# Patient Record
Sex: Male | Born: 1980 | Race: Black or African American | Hispanic: No | Marital: Single | Smoking: Current every day smoker
Health system: Southern US, Community
[De-identification: ages and names within clinical notes are randomized; demographics above are authoritative.]

## PROBLEM LIST (undated history)

## (undated) ENCOUNTER — Emergency Department (HOSPITAL_COMMUNITY): Payer: Self-pay

---
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
## 2008-12-05 ENCOUNTER — Emergency Department (HOSPITAL_COMMUNITY): Admission: EM | Admit: 2008-12-05 | Discharge: 2008-12-05 | Payer: Self-pay | Admitting: Emergency Medicine
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
## 2010-10-28 ENCOUNTER — Emergency Department (HOSPITAL_COMMUNITY)
Admission: EM | Admit: 2010-10-28 | Discharge: 2010-10-29 | Disposition: A | Discharge status: Home / Self Care | Payer: No Typology Code available for payment source | Attending: Emergency Medicine | Admitting: Emergency Medicine

## 2010-10-28 DIAGNOSIS — R51 Headache: Secondary | ICD-10-CM | POA: Insufficient documentation

## 2010-10-28 DIAGNOSIS — M545 Low back pain, unspecified: Secondary | ICD-10-CM | POA: Insufficient documentation

## 2011-01-01 LAB — COMPREHENSIVE METABOLIC PANEL
Albumin: 4.1 g/dL (ref 3.5–5.2)
BUN: 10 mg/dL (ref 6–23)
Calcium: 9 mg/dL (ref 8.4–10.5)
Glucose, Bld: 92 mg/dL (ref 70–99)
Potassium: 4 mEq/L (ref 3.5–5.1)
Total Protein: 6.9 g/dL (ref 6.0–8.3)

## 2011-01-01 LAB — CBC
HCT: 46.9 % (ref 39.0–52.0)
Hemoglobin: 16 g/dL (ref 13.0–17.0)
MCHC: 34 g/dL (ref 30.0–36.0)
Platelets: 197 10*3/uL (ref 150–400)
RDW: 13.7 % (ref 11.5–15.5)

## 2011-01-01 LAB — URINE MICROSCOPIC-ADD ON

## 2011-01-01 LAB — URINALYSIS, ROUTINE W REFLEX MICROSCOPIC
Glucose, UA: NEGATIVE mg/dL
Leukocytes, UA: NEGATIVE
Specific Gravity, Urine: 1.028 (ref 1.005–1.030)
pH: 5.5 (ref 5.0–8.0)

## 2015-11-23 ENCOUNTER — Ambulatory Visit (INDEPENDENT_AMBULATORY_CARE_PROVIDER_SITE_OTHER): Payer: Self-pay | Admitting: Family Medicine

## 2015-11-23 VITALS — BP 124/90 | HR 62 | Temp 97.3°F | Resp 18 | Ht 69.5 in | Wt 187.0 lb

## 2015-11-23 DIAGNOSIS — Z024 Encounter for examination for driving license: Secondary | ICD-10-CM

## 2015-11-23 DIAGNOSIS — Z021 Encounter for pre-employment examination: Secondary | ICD-10-CM

## 2015-11-23 NOTE — Patient Instructions (Signed)
Return as needed  Advised to stop smoking as discussed.

## 2015-11-23 NOTE — Progress Notes (Signed)
Patient ID: Kenneth Wiley, male    DOB: 18-May-1981  Age: 35 y.o. MRN: [id number]  Chief Complaint  Patient presents with  . Annual Exam    DOT PE    Subjective:   35 year old male who is here for his DOT physical examination. He takes these every 2 years and has not had any problems in getting his card.  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 He has 2 children, 2 stepchildren. He does smoke and we discussed quitting.  Review of systems: Unremarkable    Current allergies, medications, problem list, past/family and social histories reviewed.  Objective:  Pulse 62  Temp(Src) 97.3 F (36.3 C) (Oral)  Resp 18  Ht 5' 9.5" (1.765 m)  Wt 187 lb (84.823 kg)  BMI 27.23 kg/m2  SpO2 98%  Physical exam: Healthy-appearing young man in no acute distress. TMs normal. Eyes PERRLA. EOMs intact. Throat clear. Neck supple without nodes or thyromegaly. No carotid bruits. Chest is clear to auscultation. Heart regular without murmurs. Abdomen soft without mass or tenderness. Normal male external genitalia with testes descended. No hernias. Spine normal. Extremities unremarkable. Skin has a little mottling of color on his trunk but otherwise unremarkable.  Assessment & Plan:   Assessment: 1. Driver's permit physical examination       Plan: 2 year DOT card  No orders of the defined types were placed in this encounter.    No orders of the defined types were placed in this encounter.         Patient Instructions  Return as needed  Advised to stop smoking as discussed.     Return if symptoms worsen or fail to improve.   Atalaya Zappia, MD 11/23/2015

## 2017-11-24 ENCOUNTER — Encounter: Payer: Self-pay | Admitting: Physician Assistant

## 2017-11-24 ENCOUNTER — Other Ambulatory Visit: Payer: Self-pay

## 2017-11-24 ENCOUNTER — Ambulatory Visit (INDEPENDENT_AMBULATORY_CARE_PROVIDER_SITE_OTHER): Payer: Self-pay | Admitting: Physician Assistant

## 2017-11-24 VITALS — BP 120/80 | HR 80 | Temp 97.8°F | Resp 16 | Ht 69.5 in | Wt 186.2 lb

## 2017-11-24 DIAGNOSIS — F17200 Nicotine dependence, unspecified, uncomplicated: Secondary | ICD-10-CM | POA: Insufficient documentation

## 2017-11-24 DIAGNOSIS — Z23 Encounter for immunization: Secondary | ICD-10-CM

## 2017-11-24 DIAGNOSIS — Z024 Encounter for examination for driving license: Secondary | ICD-10-CM

## 2017-11-24 NOTE — Patient Instructions (Addendum)
Schedule a visit with your dentist.    IF you received an x-ray today, you will receive an invoice from [facility]. Please contact [facility] at [phone] with questions or concerns regarding your invoice.   IF you received labwork today, you will receive an invoice from [facility]. Please contact [facility] at [phone] with questions or concerns regarding your invoice.   Our billing staff will not be able to assist you with questions regarding bills from these companies.  You will be contacted with the lab results as soon as they are available. The fastest way to get your results is to activate your My Chart account. Instructions are located on the last page of this paperwork. If you have not heard from us regarding the results in 2 weeks, please contact this office.    Did you know that you begin to benefit from quitting smoking within the first twenty minutes? It's TRUE.  At 20 minutes: -blood pressure decreases -pulse rate drops -body temperature of hands and feet increases  At 8 hours: -carbon monoxide level in blood drops to normal -oxygen level in blood increases to normal  At 24 hours: -the chance of heart attack decreases  At 48 hours: -nerve endings start regrowing -ability to smell and taste is enhanced  2 weeks-3 months: -circulation improves -walking becomes easier -lung function improves  1-9 months: -coughing, sinus congestion, fatigue and shortness of breath decreases  1 year: -excess risk of heart disease is decreased to HALF that of a smoker  5 years: Stroke risk is reduced to that of people who have never smoked  10 years: -risk of lung cancer drops to as little as half that of continuing smokers -risk of cancer of the mouth, throat, esophagus, bladder, kidney and pancreas decreases -risk of ulcer decreases  15 years -risk of heart disease is now similar to that of people who have never smoked -risk of death returns to  nearly the level of people who have never smoked

## 2017-11-24 NOTE — Progress Notes (Signed)
This patient presents for DOT examination for fitness for duty.   Medical History:  1. Head/Brain Injuries, disorders or illnesses no  2. Seizures, epilepsy no  3. Eye disorders or impaired vision (except corrective lenses) no  4. Ear disorders, loss of hearing or balance no  5. Heart disease or heart attack, other cardiovascular condition no  6. Heart surgery (valve replacement/bypass, angioplasty, pacemaker/defribrillator) no  7. High blood pressure no  8. High cholesterol no  9. Chronic cough, shortness of breath or other breathing problems no  10. Lung disease (emphysema, asthma or chronic bronchitis) no  11. Kidney disease, dialysis no  12. Digestive problems  no  13. Diabetes or elevated blood sugar no                      If yes to #13, Insulin use n/a  14. Nervious or psychiatric disorders, e.g., severe depression no  15. Fainting or syncope no  16. Dizziness, headaches, numbness, tingling or memory loss no  17. Unexplained weight loss no  18. Stroke, TIA or paralysis no  19. Missing or impaired hand, arm, foot, leg, finger, toe no  20. Spinal injury or disease no  21. Bone, muscles or nerve problems no  22. Blood clots or bleeding bleeding disorders no  23. Cancer no  24. Chronic infection or other chronic diseases no  25. Sleep disorders, pauses in breathing while asleep, daytime sleepiness, loud snoring no  26. Have you ever had a sleep test? no  27.  Have you ever spent a night in the hospital? no  28. Have you ever had a broken bone? no  29. Have you or or do you use tobacco products? yes, cutting back on tobacco  30. Regular, frequent alcohol use no  31. Illegal substance use within the past 2 years no  32.  Have you ever failed a drug test or been dependent on an illegal substance? no   Current Medications: Prior to Admission medications   Not on File    Medical Examiner's Comments on Health History:  No risks for driver's safety.  TESTING:   Visual Acuity Screening   Right eye Left eye Both eyes  Without correction:     With correction: 20/13 20/13 20/13   Comments: 85 degrees peripheral   Hearing Screening Comments: 10 feet whisper test   Monocular Vision: No.  Hearing Aid used for test: No. Hearing Aid required to to meet standard: No.  BP 120/80   Pulse 80   Temp 97.8 F (36.6 C)   Resp 16   Ht 5' 9.5" (1.765 m)   Wt 186 lb 3.2 oz (84.5 kg)   SpO2 98%   BMI 27.10 kg/m  Pulse rate is regular     PHYSICAL EXAMINATION:  General Appearance Not markedly obese. No tremor, signs of alcoholism, problem drinking or drug abuse.   Skin Warm, dry and intact. Scars (none), Tattoos (none)  Eyes Pupils are equal, round and reactive to light and accommodation, extraocular movements are intact. No exophthalmos, no nystagmus. Evidence of cataracts, retinopathy, macular degeneration, aphakia, glaucoma may need referral to a specialist.  Ears Normal external ears. External canal without occlusion. No scarring of the TM. No perforation of the TM.  Mouth and Throat Clear and moist. No irremedial deformities likely to interfere with breathing or swallowing. Dental caries.  Heart No murmurs, extra sounds, evidence of cardiomegaly. No pacemaker. No implantable defibrillator.  Lungs and Chest (excluding  breasts) Normal chest expansion, respiratory rate, breath sounds. No cyanosis.  Abdomen and Viscera No liver enlargement. No splenic enlargement. No masses, bruits, hernias or significant abdominal wall weakness.  Genitourinary  No inguinal or femoral hernia.  Spine and other musculoskeletal No tenderness, no limitation of motion, no deformities. No evidence of previous surgery.  Extremities No loss or impairment of leg, foot, toe, arm, hand, finger. No perceptible limp, deformities, atrophy, weakness, paralysis, clubbing, edema, hypotonia. Patient has sufficient grasp and prehension to maintain steering wheel grip. Patient has sufficient  mobility and strength in the lower limbs to operate pedals properly.  Neurologic Normal equilibrium, coordination, speech pattern. No paresthesia, asymmetry of deep tendon reflexes, sensory or positional abnormalities. No abnormality of patellar or Babinski's reflexes.  Gait Not antalgic or ataxic  Vascular Normal pulses. No carotid or arterial bruits. No varicose veins.    Does not meet standards. Certification Status: does meet standards for 2 year certificate.  Wearing corrective lenses: yes Wearing hearing aid: no Accompanied by a N/A waiver/exemption Skill performance Evaluation (SPE) Certificate: no Driving within an exempt intracity zone: no Qualified by operation of 49 CFR 391.64: no  Certification expires 11/25/2019
